# Patient Record
Sex: Male | Born: 1995 | Race: Black or African American | Hispanic: No | Marital: Single | Smoking: Never smoker
Health system: Southern US, Community
[De-identification: ages and names within clinical notes are randomized; demographics above are authoritative.]

---

## 2019-02-21 ENCOUNTER — Other Ambulatory Visit: Payer: Self-pay

## 2019-02-21 ENCOUNTER — Emergency Department (HOSPITAL_COMMUNITY): Payer: Medicaid - Out of State

## 2019-02-21 ENCOUNTER — Encounter (HOSPITAL_COMMUNITY): Payer: Self-pay | Admitting: Emergency Medicine

## 2019-02-21 ENCOUNTER — Emergency Department (HOSPITAL_COMMUNITY)
Admission: EM | Admit: 2019-02-21 | Discharge: 2019-02-22 | Disposition: A | Discharge status: Home / Self Care | Payer: Medicaid - Out of State | Attending: Emergency Medicine | Admitting: Emergency Medicine

## 2019-02-21 DIAGNOSIS — M79671 Pain in right foot: Secondary | ICD-10-CM | POA: Diagnosis present

## 2019-02-21 DIAGNOSIS — M79674 Pain in right toe(s): Secondary | ICD-10-CM | POA: Insufficient documentation

## 2019-02-21 MED ORDER — NAPROXEN 500 MG PO TABS: 500.0000 mg | ORAL_TABLET | Freq: Two times a day (BID) | ORAL | 0 refills | Status: AC

## 2019-02-21 NOTE — ED Notes (Signed)
Pt ambulatory to room 9.

## 2019-02-21 NOTE — ED Provider Notes (Signed)
[facility] DEPT Provider Note   CSN: [id number] Arrival date & time: 02/21/19  2049     History   Chief Complaint Chief Complaint  Patient presents with  . Foot Pain    HPI Francisco Richards is a 23 y.o. male.     23 y.o male with no PMH presents to the ED with a chief complaint of right great toe pain x 1 week.  Patient reports he is currently playing football, states he felt the pain to his right great toe after practice earlier this week, he reports he is continued to feel pain along the medial aspect of his toe.  Reports he applied some athletic tape to his toe, to help with his pain which made the pain worse. He reports pain with palpation along the nail although nail remains attached.  He has not tried any symptoms for improvement in symptoms.  He denies any prior history of gout, trauma, or other injuries.  The history is provided by the patient.  Foot Pain    History reviewed. No pertinent past medical history.  There are no active problems to display for this patient.   History reviewed. No pertinent surgical history.      Home Medications    Prior to Admission medications   Medication Sig Start Date End Date Taking? Authorizing Provider  naproxen (NAPROSYN) 500 MG tablet Take 1 tablet (500 mg total) by mouth 2 (two) times daily for 7 days. 02/21/19 02/28/19  Janeece Fitting, PA-C    Family History History reviewed. No pertinent family history.  Social History Social History   Tobacco Use  . Smoking status: Never Smoker  . Smokeless tobacco: Never Used  Substance Use Topics  . Alcohol use: Not on file  . Drug use: Not on file     Allergies   Patient has no known allergies.   Review of Systems Review of Systems  Constitutional: Negative for fever.  Skin: Negative for color change, pallor, rash and wound.     Physical Exam Updated Vital Signs BP (!) 167/84 (BP Location: Right Arm)   Pulse 65   Temp 99 F (37.2  C) (Oral)   Resp 18   Ht 5\' 8"  (1.727 m)   Wt 92.5 kg   SpO2 100%   BMI 31.00 kg/m   Physical Exam Vitals signs and nursing note reviewed.  Constitutional:      Appearance: Normal appearance.  HENT:     Head: Normocephalic and atraumatic.     Mouth/Throat:     Mouth: Mucous membranes are moist.  Eyes:     Pupils: Pupils are equal, round, and reactive to light.  Neck:     Musculoskeletal: Normal range of motion and neck supple.  Cardiovascular:     Rate and Rhythm: Normal rate.     Pulses:          Dorsalis pedis pulses are 2+ on the right side.       Posterior tibial pulses are 2+ on the right side.  Pulmonary:     Effort: Pulmonary effort is normal.  Abdominal:     General: Abdomen is flat.  Feet:     Right foot:     Skin integrity: Callus present. No erythema or warmth.     Comments: Right great toe without any erythema, without tenderness to palpation on joint line, able to fully range.  Pulses are present he is neurovascularly intact.  Some pain along the medial aspect of the  nail however no paronychia present. Skin:    General: Skin is warm and dry.  Neurological:     Mental Status: He is alert and oriented to person, place, and time.      ED Treatments / Results  Labs (all labs ordered are listed, but only abnormal results are displayed) Labs Reviewed - No data to display  EKG None  Radiology Dg Foot Complete Right  Result Date: 02/21/2019 CLINICAL DATA:  Toe pain EXAM: RIGHT FOOT COMPLETE - 3+ VIEW COMPARISON:  None. FINDINGS: There is no evidence of fracture or dislocation. There is no evidence of arthropathy or other focal bone abnormality. Soft tissues are unremarkable. IMPRESSION: No acute osseus injury. Electronically Signed   By: Jonna Clark M.D.   On: 02/21/2019 22:24    Procedures Procedures (including critical care time)  Medications Ordered in ED Medications - No data to display   Initial Impression / Assessment and Plan / ED Course  I  have reviewed the triage vital signs and the nursing notes.  Pertinent labs & imaging results that were available during my care of the patient were reviewed by me and considered in my medical decision making (see chart for details).       Patient with no pertinent past medical history presents to ED with right great toe foot pain which began a week ago.  He is currently playing football, states he spends most of his day wearing cleats, feels his shoes are likely very tight.  The pain is mainly on the medial aspect of his right great toe, no signs of infection, erythema, paronychia.  Denies any prior history of gout, reports not drinking alcohol aside from occasionally.  Evaluation has full range of motion of his feet, pulses are present and he is neurovascularly intact.  X-ray of the right foot was without dislocation or fracture.  These results were discussed with patient, he has had ice to his foot while he is been in the ED, reports improvement in symptoms after ice.  Will prescribe a short course of anti-inflammatories to help with his pain.  Patient understands and agrees with management, return precautions provided at length.  Portions of this note were generated with Scientist, clinical (histocompatibility and immunogenetics). Dictation errors may occur despite best attempts at proofreading.  Final Clinical Impressions(s) / ED Diagnoses   Final diagnoses:  Pain of right great toe    ED Discharge Orders         Ordered    naproxen (NAPROSYN) 500 MG tablet  2 times daily     02/21/19 2349           Claude Manges, PA-C 02/21/19 2352    Derwood Kaplan, MD 02/22/19 [phone]

## 2019-02-21 NOTE — ED Triage Notes (Signed)
After exercising the patient noticed pain in the 1st digit of his right foot.

## 2019-02-21 NOTE — Discharge Instructions (Addendum)
Your x-ray today was within normal limits.  I prescribed a short course of anti-inflammatories to help with your pain, please take 1 tablet twice a day for the next 7 days.  You may also apply ice, elevate your leg while not playing football.  Follow-up with your primary care physician as needed.

## 2019-03-12 ENCOUNTER — Encounter (HOSPITAL_COMMUNITY): Payer: Self-pay | Admitting: Emergency Medicine

## 2019-03-12 ENCOUNTER — Emergency Department (HOSPITAL_COMMUNITY)
Admission: EM | Admit: 2019-03-12 | Discharge: 2019-03-12 | Disposition: A | Discharge status: Home / Self Care | Payer: Medicaid - Out of State | Attending: Emergency Medicine | Admitting: Emergency Medicine

## 2019-03-12 ENCOUNTER — Other Ambulatory Visit: Payer: Self-pay

## 2019-03-12 DIAGNOSIS — L739 Follicular disorder, unspecified: Secondary | ICD-10-CM | POA: Diagnosis not present

## 2019-03-12 DIAGNOSIS — R21 Rash and other nonspecific skin eruption: Secondary | ICD-10-CM | POA: Diagnosis present

## 2019-03-12 DIAGNOSIS — L299 Pruritus, unspecified: Secondary | ICD-10-CM | POA: Diagnosis not present

## 2019-03-12 MED ORDER — MUPIROCIN CALCIUM 2 % EX CREA: 1.0000 "application " | TOPICAL_CREAM | Freq: Two times a day (BID) | CUTANEOUS | 0 refills | Status: AC

## 2019-03-12 NOTE — ED Provider Notes (Signed)
[facility] DEPT Provider Note   CSN: [id number] Arrival date & time: 03/12/19  2006     History   Chief Complaint Chief Complaint  Patient presents with  . Rash    HPI Francisco Richards is a 23 y.o. male.     Patient presents for evaluation of pruritic, pustular rash on the posterior aspect of his neck. Hair in this area is closed cropped. Rash has been intermittent since August. He has tried some over the counter preparations suggested by his barber without consistent improvement.  The history is provided by the patient. No language interpreter was used.  Rash Location:  Head/neck Head/neck rash location:  Head Quality: blistering and itchiness   Severity:  Moderate Onset quality:  Gradual Duration:  2 months Timing:  Intermittent Progression:  Waxing and waning Chronicity:  Recurrent   History reviewed. No pertinent past medical history.  There are no active problems to display for this patient.   History reviewed. No pertinent surgical history.      Home Medications    Prior to Admission medications   Not on File    Family History No family history on file.  Social History Social History   Tobacco Use  . Smoking status: Never Smoker  . Smokeless tobacco: Never Used  Substance Use Topics  . Alcohol use: Not on file  . Drug use: Not on file     Allergies   Patient has no known allergies.   Review of Systems Review of Systems  Skin: Positive for rash.  All other systems reviewed and are negative.    Physical Exam Updated Vital Signs BP (!) 144/79   Pulse 74   Temp 98.9 F (37.2 C) (Oral)   Resp 15   Ht 5\' 8"  (1.727 m)   Wt 88.9 kg   SpO2 100%   BMI 29.80 kg/m   Physical Exam Vitals signs and nursing note reviewed.  Constitutional:      Appearance: Normal appearance.  HENT:     Head: Normocephalic.     Mouth/Throat:     Mouth: Mucous membranes are moist.  Eyes:     Conjunctiva/sclera:  Conjunctivae normal.  Neck:     Musculoskeletal: Normal range of motion.  Cardiovascular:     Rate and Rhythm: Normal rate and regular rhythm.  Pulmonary:     Effort: Pulmonary effort is normal.     Breath sounds: Normal breath sounds.  Abdominal:     Palpations: Abdomen is soft.  Musculoskeletal: Normal range of motion.  Neurological:     Mental Status: He is alert and oriented to person, place, and time.  Psychiatric:        Mood and Affect: Mood normal.      ED Treatments / Results  Labs (all labs ordered are listed, but only abnormal results are displayed) Labs Reviewed - No data to display  EKG None  Radiology No results found.  Procedures Procedures (including critical care time)  Medications Ordered in ED Medications - No data to display   Initial Impression / Assessment and Plan / ED Course  I have reviewed the triage vital signs and the nursing notes.  Pertinent labs & imaging results that were available during my care of the patient were reviewed by me and considered in my medical decision making (see chart for details).        Rash with appearance of folliculitis. Will treat with bactroban. Care instructions provided. Return precautions discussed.  Final Clinical  Impressions(s) / ED Diagnoses   Final diagnoses:  Folliculitis    ED Discharge Orders         Ordered    mupirocin cream (BACTROBAN) 2 %  2 times daily     03/12/19 2233           Felicie Morn, NP 03/12/19 2238    Charlynne Pander, MD 03/17/19 [phone]

## 2019-03-12 NOTE — ED Triage Notes (Signed)
Patient reports rash to upper neck since August. States "I thought it was hair bumps."

## 2020-02-13 ENCOUNTER — Encounter (HOSPITAL_COMMUNITY): Payer: Self-pay | Admitting: Emergency Medicine

## 2020-02-13 ENCOUNTER — Emergency Department (HOSPITAL_COMMUNITY)
Admission: EM | Admit: 2020-02-13 | Discharge: 2020-02-13 | Disposition: A | Discharge status: Home / Self Care | Payer: Medicaid - Out of State | Attending: Emergency Medicine | Admitting: Emergency Medicine

## 2020-02-13 ENCOUNTER — Emergency Department (HOSPITAL_COMMUNITY): Payer: Medicaid - Out of State

## 2020-02-13 ENCOUNTER — Other Ambulatory Visit: Payer: Self-pay

## 2020-02-13 DIAGNOSIS — S99912A Unspecified injury of left ankle, initial encounter: Secondary | ICD-10-CM | POA: Insufficient documentation

## 2020-02-13 DIAGNOSIS — M25532 Pain in left wrist: Secondary | ICD-10-CM

## 2020-02-13 DIAGNOSIS — W2131XA Struck by shoe cleats, initial encounter: Secondary | ICD-10-CM | POA: Diagnosis not present

## 2020-02-13 DIAGNOSIS — S6992XA Unspecified injury of left wrist, hand and finger(s), initial encounter: Secondary | ICD-10-CM | POA: Diagnosis not present

## 2020-02-13 DIAGNOSIS — Y9361 Activity, american tackle football: Secondary | ICD-10-CM | POA: Diagnosis not present

## 2020-02-13 DIAGNOSIS — Y9289 Other specified places as the place of occurrence of the external cause: Secondary | ICD-10-CM | POA: Diagnosis not present

## 2020-02-13 NOTE — Progress Notes (Signed)
Orthopedic Tech Progress Note Patient Details:  Francisco Richards 16-Feb-1996 [id number]  Ortho Devices Ortho Device/Splint Location: velcro wrist forearm Ortho Device/Splint Interventions: Ordered, Application   Post Interventions Patient Tolerated: Well Instructions Provided: Care of device   [name] 02/13/2020, 2:29 PM

## 2020-02-13 NOTE — ED Triage Notes (Signed)
Patient reports injuring L ankle and L wrist approximately 1 week ago during a football game. States he does not know exactly how he injured it. Reports mild pain at both sites.

## 2020-02-13 NOTE — Discharge Instructions (Addendum)
X-rays did not show wrist fracture or dislocation.  I have given you a splint.  I recommend taking over-the-counter pain medications like ibuprofen and or Tylenol every 6 as needed for pain please follow dosing the back of bottle.  I recommend applying heat to the area and stretching it as this can help decrease stiffness and pain.  I want you to follow-up with an orthopedic doctor for further evaluation. I have given you the contact information for one please call at your earliest convenience.  come back to emergency department if you develop worsening pain at the wrist, wrist swelling, numbness or tingling in your fingers, chest pain, shortness of breath, abdominal pain, nausea, vomiting, diarrhea.

## 2020-02-13 NOTE — ED Provider Notes (Signed)
[facility]-EMERGENCY DEPT Provider Note   CSN: [id number] Arrival date & time: 02/13/20  1236     History Chief Complaint  Patient presents with  . Wrist Pain  . Ankle Pain    Francisco Richards is a 24 y.o. male.  HPI   Patient with no significant medical history presents to the emergency department with chief complaint of left ankle and left wrist pain that started last week.  Patient states while he was playing football he injured his wrist and someone stepped on his left ankle with their cleat.  States he does not have any ankle pain but noticed a small bump on the medial aspect of his left ankle above his malleolus.  Denies pain, bleeding, discharge, drainage.  Patient admits that his left wrist pain when he flexes or extends it, states it is tender when he touches it on the distal end of his ulna.  He is able to open and close his fingers without any difficulty.  He denies any alleviating factors.  Patient denies hitting his head, losing conscious, being on anticoags, headache, fever, chills, shortness breath, chest pain, vomiting, nausea, vomiting, diarrhea, pedal edema.  History reviewed. No pertinent past medical history.  There are no problems to display for this patient.   History reviewed. No pertinent surgical history.     History reviewed. No pertinent family history.  Social History   Tobacco Use  . Smoking status: Never Smoker  . Smokeless tobacco: Never Used  Substance Use Topics  . Alcohol use: Not on file  . Drug use: Not on file    Home Medications Prior to Admission medications   Medication Sig Start Date End Date Taking? Authorizing Provider  mupirocin cream (BACTROBAN) 2 % Apply 1 application topically 2 (two) times daily. 03/12/19   Felicie Morn, NP    Allergies    Patient has no known allergies.  Review of Systems   Review of Systems  Constitutional: Negative for chills and fever.  HENT: Negative for congestion,  tinnitus, trouble swallowing and voice change.   Eyes: Negative for visual disturbance.  Respiratory: Negative for cough and shortness of breath.   Cardiovascular: Negative for chest pain and palpitations.  Gastrointestinal: Negative for abdominal pain, diarrhea, nausea and vomiting.  Genitourinary: Negative for enuresis, flank pain, frequency and genital sores.  Musculoskeletal: Negative for back pain.       Left wrist pain   Skin: Negative for rash.       Mark on left ankle.  Neurological: Negative for dizziness and headaches.  Hematological: Does not bruise/bleed easily.    Physical Exam Updated Vital Signs BP 135/79 (BP Location: Right Arm)   Pulse 94   Temp 98.8 F (37.1 C) (Oral)   Resp 16   SpO2 94%   Physical Exam Vitals and nursing note reviewed.  Constitutional:      General: He is not in acute distress.    Appearance: Normal appearance. He is not ill-appearing or diaphoretic.  HENT:     Head: Normocephalic and atraumatic.     Nose: No congestion or rhinorrhea.  Eyes:     General: No scleral icterus.       Right eye: No discharge.        Left eye: No discharge.     Conjunctiva/sclera: Conjunctivae normal.  Pulmonary:     Effort: Pulmonary effort is normal. No respiratory distress.     Breath sounds: Normal breath sounds. No wheezing.  Musculoskeletal:  General: Tenderness and signs of injury present.     Cervical back: Neck supple.     Right lower leg: No edema.     Left lower leg: No edema.     Comments: Patient's left wrist was visualized, no gross abnormalities noted, no erythema, edema, drainage or discharge noted.  Patient had full range of motion, 5 out of 5 strength at the wrist, and proximal, middle, distal joints of the fingers.  He was tender to palpation on the distal end of his ulna.  No deformities noted.  Neurovascular fully intact.   Skin:    General: Skin is warm and dry.     Coloration: Skin is not jaundiced or pale.     Findings:  Lesion present.     Comments: Patient's left ankle was visualized, there was a small scar present above the medial malleolus.  There is no erythema, edema, drainage, discharge noted.  It was nontender to palpation, slightly raised possibly from scar tissue.  Neurological:     Mental Status: He is alert and oriented to person, place, and time.  Psychiatric:        Mood and Affect: Mood normal.     ED Results / Procedures / Treatments   Labs (all labs ordered are listed, but only abnormal results are displayed) Labs Reviewed - No data to display  EKG None  Radiology No results found.  Procedures Procedures (including critical care time)  Medications Ordered in ED Medications - No data to display  ED Course  I have reviewed the triage vital signs and the nursing notes.  Pertinent labs & imaging results that were available during my care of the patient were reviewed by me and considered in my medical decision making (see chart for details).    MDM Rules/Calculators/A&P                          Patient presents with left wrist pain as well as marked left ankle. He is alert, did not appear in acute distress, vital signs reassuring. Will order x-ray of left wrist for further evaluation.  X-ray did not show any acute findings.  I have low suspicion for dislocation or fracture as patient had full range of motion as well as a negative x-ray. Low suspicion for tendon or ligament rupture as he had full range of motion at his wrist as well as his proximal, middle, distal joints of his fingers. Low suspicion for compartment syndrome as hand was soft to the touch, neurovascular fully intact. I suspect patient may have strained his wrist will place in a splint provide him with exercise and follow-up with Ortho. Low suspicion patient has cellulitis on his left ankle as there is no signs of infection, non tender to palpation, no fluctuance or indurations felt. I suspect patient has scar tissue  which makes it slightly more elevated than the skin around it.  Vital signs have remained stable, no indication for hospital mission. Patient given at home care as well as strict return precautions. Patient verbalized that he understood agreed with said plan Final Clinical Impression(s) / ED Diagnoses Final diagnoses:  None    Rx / DC Orders ED Discharge Orders    None       Carroll Sage, PA-C 02/13/20 1452    Geoffery Lyons, MD 02/14/20 1347

## 2020-06-14 ENCOUNTER — Ambulatory Visit: Payer: PRIVATE HEALTH INSURANCE | Attending: Internal Medicine

## 2020-06-14 DIAGNOSIS — Z23 Encounter for immunization: Secondary | ICD-10-CM

## 2020-06-14 NOTE — Progress Notes (Signed)
   Covid-19 Vaccination Clinic  Name:  Deaunte Dente    MRN: [id number] DOB: [birth year]  06/14/2020  Mr. Weidinger was observed post Covid-19 immunization for 15 minutes without incident. He was provided with Vaccine Information Sheet and instruction to access the V-Safe system.   Mr. Wilbert was instructed to call 911 with any severe reactions post vaccine: Marland Kitchen Difficulty breathing  . Swelling of face and throat  . A fast heartbeat  . A bad rash all over body  . Dizziness and weakness   Immunizations Administered    Name Date Dose VIS Date Route   PFIZER Comrnaty(Gray TOP) Covid-19 Vaccine 06/14/2020  1:54 PM 0.3 mL 04/28/2020 Intramuscular   Manufacturer: [employer], [employer]   Lot: [id number]   NDC: [phone]

## 2020-06-14 NOTE — Progress Notes (Signed)
   Covid-19 Vaccination Clinic  Name:  Francisco Richards    MRN: [id number] DOB: 10/29/1995  06/14/2020  [name] was observed post Covid-19 immunization for 15 minutes without incident. He was provided with Vaccine Information Sheet and instruction to access the V-Safe system.   [name] was instructed to call 911 with any severe reactions post vaccine: . Difficulty breathing  . Swelling of face and throat  . A fast heartbeat  . A bad rash all over body  . Dizziness and weakness   Immunizations Administered    Name Date Dose VIS Date Route   PFIZER Comrnaty(Gray TOP) Covid-19 Vaccine 06/14/2020  1:54 PM 0.3 mL 04/28/2020 Intramuscular   Manufacturer: [employer] Inc   Lot: [id number]   NDC: [address]-1     

## 2020-07-05 ENCOUNTER — Ambulatory Visit: Payer: PRIVATE HEALTH INSURANCE | Attending: Internal Medicine

## 2020-07-05 DIAGNOSIS — Z23 Encounter for immunization: Secondary | ICD-10-CM

## 2020-07-05 NOTE — Progress Notes (Signed)
   Covid-19 Vaccination Clinic  Name:  Francisco Richards    MRN: [id number] DOB: May 18, 1996  07/05/2020  Francisco Richards was observed post Covid-19 immunization for 15 minutes without incident. He was provided with Vaccine Information Sheet and instruction to access the V-Safe system.   Francisco Richards was instructed to call 911 with any severe reactions post vaccine: Marland Kitchen Difficulty breathing  . Swelling of face and throat  . A fast heartbeat  . A bad rash all over body  . Dizziness and weakness   Immunizations Administered    Name Date Dose VIS Date Route   PFIZER Comrnaty(Gray TOP) Covid-19 Vaccine 07/05/2020  1:55 PM 0.3 mL 04/28/2020 Intramuscular   Manufacturer: [employer], [employer]   Lot: [id number]   NDC: [phone]

## 2021-01-24 ENCOUNTER — Emergency Department (HOSPITAL_COMMUNITY)
Admission: EM | Admit: 2021-01-24 | Discharge: 2021-01-25 | Disposition: A | Discharge status: Home / Self Care | Payer: Medicaid - Out of State | Attending: Emergency Medicine | Admitting: Emergency Medicine

## 2021-01-24 DIAGNOSIS — H60312 Diffuse otitis externa, left ear: Secondary | ICD-10-CM | POA: Insufficient documentation

## 2021-01-24 DIAGNOSIS — H9202 Otalgia, left ear: Secondary | ICD-10-CM | POA: Diagnosis present

## 2021-01-25 ENCOUNTER — Other Ambulatory Visit: Payer: Self-pay

## 2021-01-25 ENCOUNTER — Encounter (HOSPITAL_COMMUNITY): Payer: Self-pay

## 2021-01-25 MED ORDER — CIPROFLOXACIN-DEXAMETHASONE 0.3-0.1 % OT SUSP
4.0000 [drp] | Freq: Once | OTIC | Status: AC
  Administered 2021-01-25: 4 [drp] via OTIC
  Filled 2021-01-25: qty 7.5

## 2021-01-25 NOTE — ED Provider Notes (Signed)
[facility]-EMERGENCY DEPT Provider Note   CSN: [id number] Arrival date & time: 01/24/21  2352     History Chief Complaint  Patient presents with   Ear Fullness   Otalgia    Francisco Richards is a 25 y.o. male.  The history is provided by the patient and medical records.  Ear Fullness  Otalgia  25 year old male presenting to the ED with left ear pain.  States his has been feeling "full" for the last week and a half.  States his hearing now is starting to sound muffled.  Does report cleaning his ears a lot with Q-tips and has had infection from this before.  He denies any fevers.  History reviewed. No pertinent past medical history.  There are no problems to display for this patient.   History reviewed. No pertinent surgical history.     History reviewed. No pertinent family history.  Social History   Tobacco Use   Smoking status: Never   Smokeless tobacco: Never    Home Medications Prior to Admission medications   Medication Sig Start Date End Date Taking? Authorizing Provider  mupirocin cream (BACTROBAN) 2 % Apply 1 application topically 2 (two) times daily. 03/12/19   Felicie Morn, NP    Allergies    Patient has no known allergies.  Review of Systems   Review of Systems  HENT:  Positive for ear pain.   All other systems reviewed and are negative.  Physical Exam Updated Vital Signs BP (!) 147/82 (BP Location: Right Arm)   Pulse (!) 43   Temp 98.6 F (37 C) (Oral)   Resp 16   Ht 5\' 8"  (1.727 m)   Wt 80.7 kg   SpO2 99%   BMI 27.06 kg/m   Physical Exam Vitals and nursing note reviewed.  Constitutional:      Appearance: He is well-developed.  HENT:     Head: Normocephalic and atraumatic.     Ears:     Comments: Right ear is normal Left EAC is swollen and tender to touch, TM fully visualized and is normal in appearance Eyes:     Conjunctiva/sclera: Conjunctivae normal.     Pupils: Pupils are equal, round, and reactive to  light.  Cardiovascular:     Rate and Rhythm: Normal rate and regular rhythm.     Heart sounds: Normal heart sounds.  Pulmonary:     Effort: Pulmonary effort is normal.     Breath sounds: Normal breath sounds.  Abdominal:     General: Bowel sounds are normal.     Palpations: Abdomen is soft.  Musculoskeletal:        General: Normal range of motion.     Cervical back: Normal range of motion.  Skin:    General: Skin is warm and dry.  Neurological:     Mental Status: He is alert and oriented to person, place, and time.    ED Results / Procedures / Treatments   Labs (all labs ordered are listed, but only abnormal results are displayed) Labs Reviewed - No data to display  EKG None  Radiology No results found.  Procedures Procedures   Medications Ordered in ED Medications  ciprofloxacin-dexamethasone (CIPRODEX) 0.3-0.1 % OTIC (EAR) suspension 4 drop (4 drops Left EAR Given 01/25/21 0047)    ED Course  I have reviewed the triage vital signs and the nursing notes.  Pertinent labs & imaging results that were available during my care of the patient were reviewed by me and considered  in my medical decision making (see chart for details).    MDM Rules/Calculators/A&P                           25 year old male here with left-sided ear pain and fullness.  Now reports muffled hearing.  Has been cleaning his ears with Q-tips.  On exam he has findings of otitis externa.  His TM is currently visualized and there are no signs of infection or perforation.  Will start on Ciprodex drops.  Advised to refrain from sticking Q-tips in his ears.  Can follow-up with PCP.  Return here for new concerns.  Final Clinical Impression(s) / ED Diagnoses Final diagnoses:  Acute diffuse otitis externa of left ear    Rx / DC Orders ED Discharge Orders     None        Garlon Hatchet, PA-C 01/25/21 0054    Nira Conn, MD 01/25/21 [phone]

## 2021-01-25 NOTE — ED Triage Notes (Signed)
Pt complains of left ear pain x 1.5 weeks. Pt states that he has a full feeling in his ear.

## 2021-01-25 NOTE — Discharge Instructions (Addendum)
Continue using drops in ears--- 4 drops left ear twice daily for the next week. Avoid using q-tips inside the ear. Follow-up with your doctor. Return here for new concerns.

## 2021-03-14 IMAGING — CR DG WRIST COMPLETE 3+V*L*
4 series · 4 of 4 positions shown · non-contrast
Comparison: None.

CLINICAL DATA: Patient with left breast pain 1 week. Injury during
football.

EXAM:
LEFT WRIST - COMPLETE 3+ VIEW

[x wrist pa left]
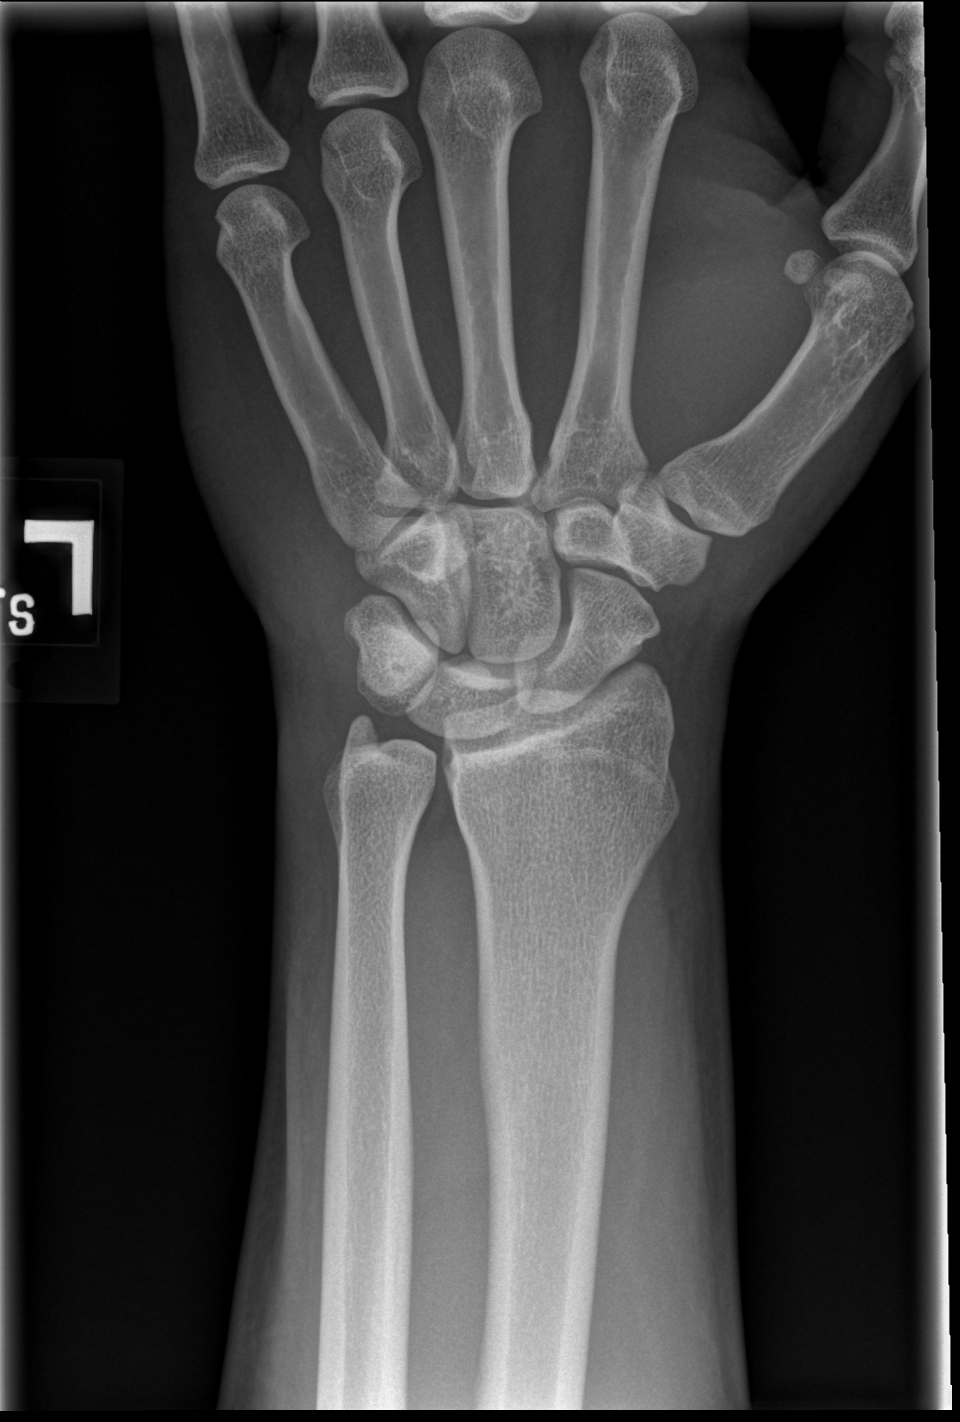

[x wrist obl left]
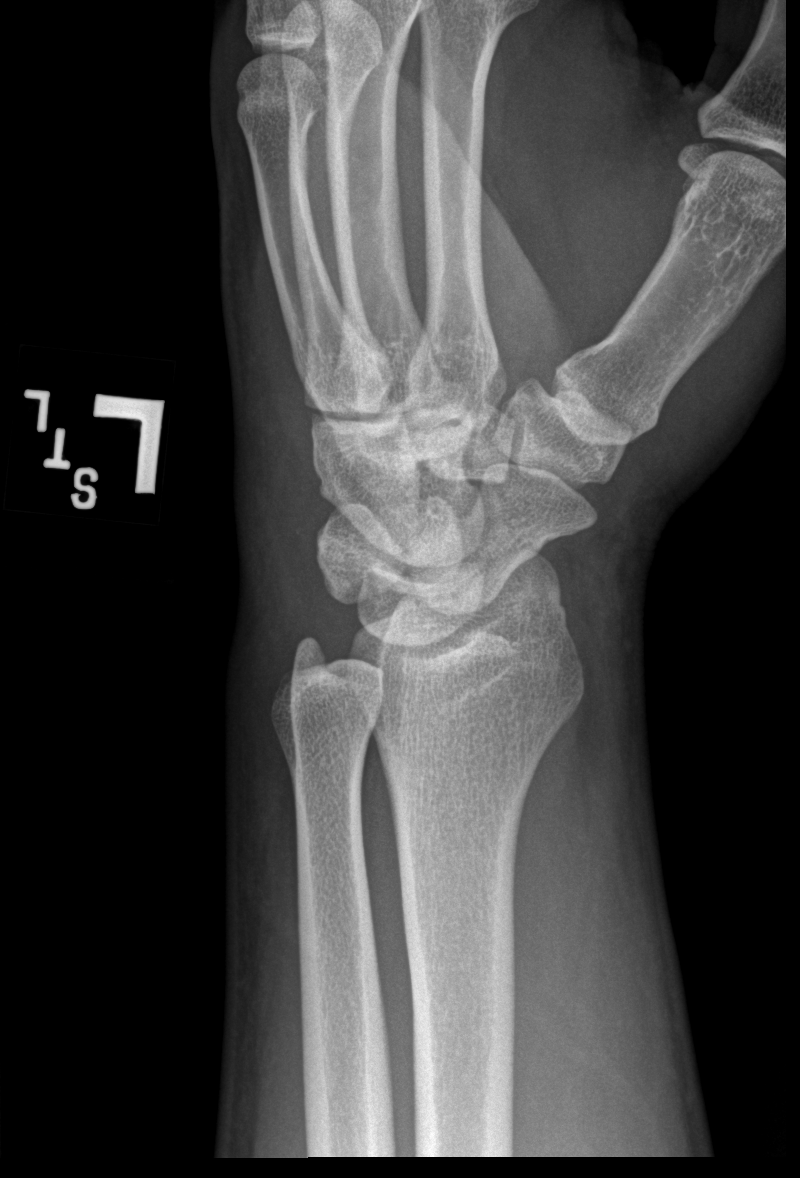

[x wrist lat left]
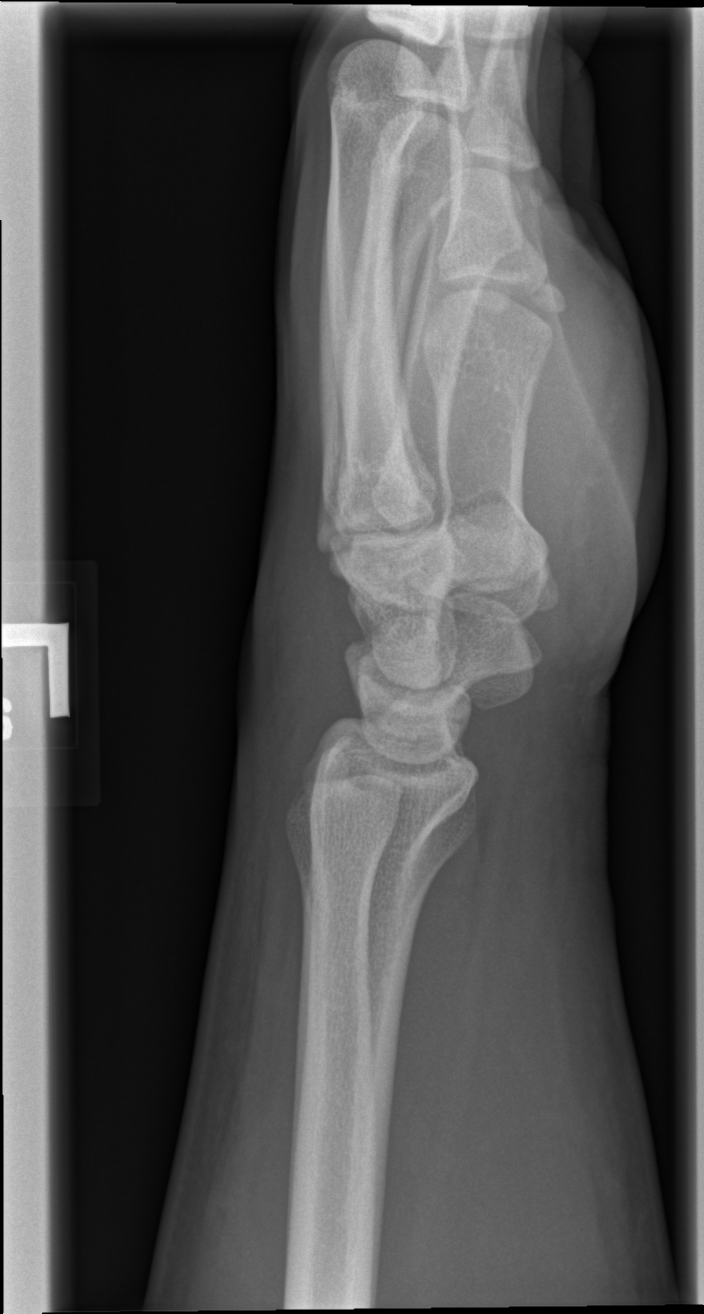

[x wrist navicular view left]
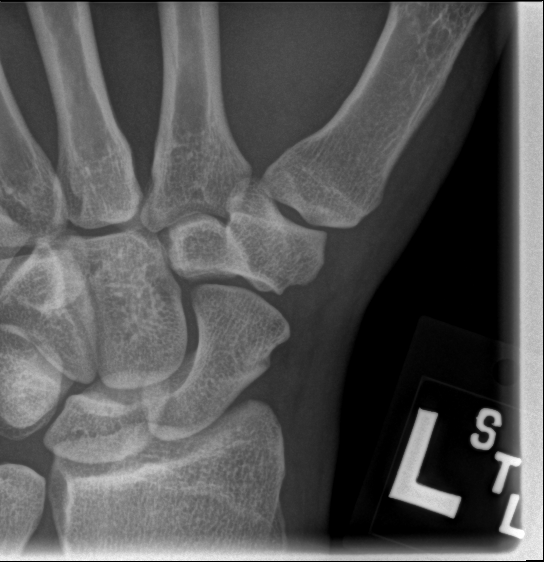

[4 of 4 positions shown; findings below may reference images not displayed]

FINDINGS: Normal anatomic alignment. No evidence for acute fracture or
dislocation. Regional soft tissues are unremarkable.
IMPRESSION: No acute osseous abnormality.

## 2021-06-03 ENCOUNTER — Encounter (HOSPITAL_COMMUNITY): Payer: Self-pay | Admitting: Emergency Medicine

## 2021-06-03 ENCOUNTER — Emergency Department (HOSPITAL_COMMUNITY)
Admission: EM | Admit: 2021-06-03 | Discharge: 2021-06-03 | Disposition: A | Discharge status: Home / Self Care | Payer: PRIVATE HEALTH INSURANCE | Attending: Emergency Medicine | Admitting: Emergency Medicine

## 2021-06-03 DIAGNOSIS — Z5321 Procedure and treatment not carried out due to patient leaving prior to being seen by health care provider: Secondary | ICD-10-CM | POA: Insufficient documentation

## 2021-06-03 DIAGNOSIS — K0889 Other specified disorders of teeth and supporting structures: Secondary | ICD-10-CM | POA: Insufficient documentation

## 2021-06-03 NOTE — ED Notes (Signed)
Patient called for room placement x1 with no answer. 

## 2021-06-03 NOTE — ED Triage Notes (Signed)
Patient c/o R lower dental pain since last night. Denies having dentist.
